# Patient Record
Sex: Female | Born: 1985 | Hispanic: No | Marital: Single | Smoking: Current every day smoker
Health system: Southern US, Community
[De-identification: ages and names within clinical notes are randomized; demographics above are authoritative.]

## PROBLEM LIST (undated history)

## (undated) HISTORY — PX: DILATION AND CURETTAGE OF UTERUS: SHX78

---

## 2019-02-16 ENCOUNTER — Encounter (HOSPITAL_COMMUNITY): Payer: Self-pay

## 2019-02-16 ENCOUNTER — Other Ambulatory Visit: Payer: Self-pay

## 2019-02-16 ENCOUNTER — Emergency Department (HOSPITAL_COMMUNITY)
Admission: EM | Admit: 2019-02-16 | Discharge: 2019-02-16 | Disposition: A | Discharge status: Home / Self Care | Payer: Self-pay | Attending: Emergency Medicine | Admitting: Emergency Medicine

## 2019-02-16 DIAGNOSIS — N944 Primary dysmenorrhea: Secondary | ICD-10-CM | POA: Insufficient documentation

## 2019-02-16 DIAGNOSIS — N946 Dysmenorrhea, unspecified: Secondary | ICD-10-CM

## 2019-02-16 LAB — POC URINE PREG, ED: Preg Test, Ur: NEGATIVE

## 2019-02-16 LAB — HCG, SERUM, QUALITATIVE: Preg, Serum: NEGATIVE

## 2019-02-16 MED ORDER — IBUPROFEN 600 MG PO TABS: 600.0000 mg | ORAL_TABLET | Freq: Four times a day (QID) | ORAL | 0 refills | Status: DC | PRN

## 2019-02-16 MED ORDER — IBUPROFEN 800 MG PO TABS
800.0000 mg | ORAL_TABLET | Freq: Once | ORAL | Status: AC
  Administered 2019-02-16: 800 mg via ORAL
  Filled 2019-02-16: qty 1

## 2019-02-16 NOTE — ED Triage Notes (Signed)
Pt reports moving down here about a month ago from [place]. Pt says she took a pregnancy test about 2 months ago and it was positive. Pt is c/o abd cramping and "spotting blood" for the past 2-3 days. Pt has hx of miscarriage. Pt has had no prenatal care.

## 2019-02-16 NOTE — ED Provider Notes (Signed)
[facility] EMERGENCY DEPARTMENT Provider Note   CSN: [id number] Arrival date & time: 02/16/19  3335    History   Chief Complaint Chief Complaint  Patient presents with  . Abdominal Pain    HPI Molly Koch is a 33 y.o. female.     The history is provided by the patient. No language interpreter was used.  Abdominal Pain  Pain location:  Generalized Pain quality: aching   Pain radiates to:  Does not radiate Onset quality:  Gradual Timing:  Constant Progression:  Worsening Chronicity:  New Relieved by:  Nothing Worsened by:  Nothing Ineffective treatments:  None tried Associated symptoms: no vomiting    Pt reports she had a positive pregnancy test last month.  Pt reports she is now having bleeding.  Pt reports cramping History reviewed. No pertinent past medical history.  There are no active problems to display for this patient.   Past Surgical History:  Procedure Laterality Date  . DILATION AND CURETTAGE OF UTERUS       OB History    Gravida  1   Para      Term      Preterm      AB      Living        SAB      TAB      Ectopic      Multiple      Live Births               Home Medications    Prior to Admission medications   Not on File    Family History No family history on file.  Social History Social History   Tobacco Use  . Smoking status: Current Every Day Smoker    Packs/day: 0.50    Types: Cigarettes  . Smokeless tobacco: Never Used  Substance Use Topics  . Alcohol use: Not on file  . Drug use: Not on file     Allergies   Patient has no known allergies.   Review of Systems Review of Systems  Gastrointestinal: Positive for abdominal pain. Negative for vomiting.  All other systems reviewed and are negative.    Physical Exam Updated Vital Signs BP 104/81 (BP Location: Right Arm)   Pulse 72   Temp (!) 97.3 F (36.3 C) (Oral)   Resp 17   Ht 5\' 3"  (1.6 m)   Wt 70.3 kg   LMP 11/29/2018 (Approximate)  Comment: home preg test positive- 2 months ago  SpO2 100%   BMI 27.46 kg/m   Physical Exam Vitals signs and nursing note reviewed.  Constitutional:      General: She is not in acute distress.    Appearance: She is well-developed.  HENT:     Head: Normocephalic and atraumatic.  Eyes:     Conjunctiva/sclera: Conjunctivae normal.  Neck:     Musculoskeletal: Neck supple.  Cardiovascular:     Rate and Rhythm: Normal rate and regular rhythm.     Heart sounds: No murmur.  Pulmonary:     Effort: Pulmonary effort is normal. No respiratory distress.     Breath sounds: Normal breath sounds.  Abdominal:     General: Bowel sounds are normal.     Palpations: Abdomen is soft.     Tenderness: There is generalized abdominal tenderness.  Genitourinary:    Vagina: Bleeding present.     Cervix: Normal.     Uterus: Normal.      Adnexa: Right adnexa normal and left  adnexa normal.  Skin:    General: Skin is warm and dry.  Neurological:     General: No focal deficit present.     Mental Status: She is alert.  Psychiatric:        Mood and Affect: Mood normal.      ED Treatments / Results  Labs (all labs ordered are listed, but only abnormal results are displayed) Labs Reviewed  HCG, SERUM, QUALITATIVE  POC URINE PREG, ED    EKG None  Radiology No results found.  Procedures Procedures (including critical care time)  Medications Ordered in ED Medications  ibuprofen (ADVIL) tablet 800 mg (has no administration in time range)     Initial Impression / Assessment and Plan / ED Course  I have reviewed the triage vital signs and the nursing notes.  Pertinent labs & imaging results that were available during my care of the patient were reviewed by me and considered in my medical decision making (see chart for details).        MDM  Exam consistent with menstrual cycle.  Pregnancy test is negative Pt given ibuprofen here and rx for ibuprofen   Final Clinical Impressions(s) / ED  Diagnoses   Final diagnoses:  Menstrual cramps    ED Discharge Orders         Ordered    ibuprofen (ADVIL) 600 MG tablet  Every 6 hours PRN     02/16/19 2215        An After Visit Summary was printed and given to the patient.    Osie CheeksSofia, Nnamdi Dacus K, PA-C 02/16/19 2215    Mancel BaleWentz, Elliott, MD 02/17/19 1134

## 2019-02-16 NOTE — Discharge Instructions (Signed)
Return if any problems.

## 2019-02-23 ENCOUNTER — Emergency Department (HOSPITAL_COMMUNITY): Payer: Self-pay

## 2019-02-23 ENCOUNTER — Other Ambulatory Visit: Payer: Self-pay

## 2019-02-23 ENCOUNTER — Emergency Department (HOSPITAL_COMMUNITY)
Admission: EM | Admit: 2019-02-23 | Discharge: 2019-02-23 | Disposition: A | Discharge status: Home / Self Care | Payer: Self-pay | Attending: Emergency Medicine | Admitting: Emergency Medicine

## 2019-02-23 ENCOUNTER — Encounter (HOSPITAL_COMMUNITY): Payer: Self-pay | Admitting: Emergency Medicine

## 2019-02-23 DIAGNOSIS — M79672 Pain in left foot: Secondary | ICD-10-CM | POA: Insufficient documentation

## 2019-02-23 DIAGNOSIS — M25521 Pain in right elbow: Secondary | ICD-10-CM | POA: Insufficient documentation

## 2019-02-23 DIAGNOSIS — F17210 Nicotine dependence, cigarettes, uncomplicated: Secondary | ICD-10-CM | POA: Insufficient documentation

## 2019-02-23 DIAGNOSIS — M79641 Pain in right hand: Secondary | ICD-10-CM | POA: Insufficient documentation

## 2019-02-23 MED ORDER — NAPROXEN 500 MG PO TABS: ORAL_TABLET | ORAL | 0 refills | Status: DC
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
## 2019-02-23 NOTE — Discharge Instructions (Addendum)
Follow up with a family md or Dr. Romeo Apple if not improving
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
## 2019-07-11 ENCOUNTER — Other Ambulatory Visit: Payer: Self-pay

## 2019-07-11 ENCOUNTER — Emergency Department (HOSPITAL_COMMUNITY)
Admission: EM | Admit: 2019-07-11 | Discharge: 2019-07-11 | Disposition: A | Discharge status: Home / Self Care | Payer: Self-pay | Attending: Emergency Medicine | Admitting: Emergency Medicine

## 2019-07-11 ENCOUNTER — Encounter (HOSPITAL_COMMUNITY): Payer: Self-pay | Admitting: Emergency Medicine

## 2019-07-11 ENCOUNTER — Emergency Department (HOSPITAL_COMMUNITY): Payer: Self-pay

## 2019-07-11 DIAGNOSIS — W2209XA Striking against other stationary object, initial encounter: Secondary | ICD-10-CM | POA: Insufficient documentation

## 2019-07-11 DIAGNOSIS — Y929 Unspecified place or not applicable: Secondary | ICD-10-CM | POA: Insufficient documentation

## 2019-07-11 DIAGNOSIS — S62314A Displaced fracture of base of fourth metacarpal bone, right hand, initial encounter for closed fracture: Secondary | ICD-10-CM | POA: Insufficient documentation

## 2019-07-11 DIAGNOSIS — F17210 Nicotine dependence, cigarettes, uncomplicated: Secondary | ICD-10-CM | POA: Insufficient documentation

## 2019-07-11 DIAGNOSIS — Y999 Unspecified external cause status: Secondary | ICD-10-CM | POA: Insufficient documentation

## 2019-07-11 DIAGNOSIS — S62304A Unspecified fracture of fourth metacarpal bone, right hand, initial encounter for closed fracture: Secondary | ICD-10-CM

## 2019-07-11 DIAGNOSIS — Y939 Activity, unspecified: Secondary | ICD-10-CM | POA: Insufficient documentation

## 2019-07-11 MED ORDER — OXYCODONE-ACETAMINOPHEN 5-325 MG PO TABS: 1.0000 | ORAL_TABLET | ORAL | 0 refills | Status: AC | PRN

## 2019-07-11 MED ORDER — OXYCODONE-ACETAMINOPHEN 5-325 MG PO TABS
1.0000 | ORAL_TABLET | Freq: Once | ORAL | Status: AC
  Administered 2019-07-11: 1 via ORAL
  Filled 2019-07-11: qty 1

## 2019-07-11 NOTE — Discharge Instructions (Addendum)
Keep your hand elevated and splinted.  Call Dr. Debroah Loop office tomorrow to arrange a follow-up appointment.  You may use the sling as needed.  Ibuprofen 600 mg every 6-8 hours if needed for pain.  Take with food.
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
## 2019-07-11 NOTE — ED Notes (Signed)
Ice pack to right hand

## 2019-07-12 MED FILL — Oxycodone w/ Acetaminophen Tab 5-325 MG: ORAL | Qty: 6 | Status: AC

## 2019-07-20 ENCOUNTER — Encounter (HOSPITAL_COMMUNITY): Payer: Self-pay | Admitting: Emergency Medicine

## 2019-07-20 ENCOUNTER — Other Ambulatory Visit: Payer: Self-pay

## 2019-07-20 DIAGNOSIS — Y999 Unspecified external cause status: Secondary | ICD-10-CM | POA: Insufficient documentation

## 2019-07-20 DIAGNOSIS — S0083XA Contusion of other part of head, initial encounter: Secondary | ICD-10-CM | POA: Insufficient documentation

## 2019-07-20 DIAGNOSIS — F17210 Nicotine dependence, cigarettes, uncomplicated: Secondary | ICD-10-CM | POA: Insufficient documentation

## 2019-07-20 DIAGNOSIS — Y9389 Activity, other specified: Secondary | ICD-10-CM | POA: Insufficient documentation

## 2019-07-20 DIAGNOSIS — Y9289 Other specified places as the place of occurrence of the external cause: Secondary | ICD-10-CM | POA: Insufficient documentation

## 2019-07-20 DIAGNOSIS — M542 Cervicalgia: Secondary | ICD-10-CM | POA: Insufficient documentation
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
## 2019-07-21 ENCOUNTER — Emergency Department (HOSPITAL_COMMUNITY): Payer: Self-pay

## 2019-07-21 ENCOUNTER — Emergency Department (HOSPITAL_COMMUNITY)
Admission: EM | Admit: 2019-07-21 | Discharge: 2019-07-21 | Disposition: A | Discharge status: Home / Self Care | Payer: Self-pay | Attending: Emergency Medicine | Admitting: Emergency Medicine

## 2019-07-21 DIAGNOSIS — M542 Cervicalgia: Secondary | ICD-10-CM

## 2019-07-21 DIAGNOSIS — S0083XA Contusion of other part of head, initial encounter: Secondary | ICD-10-CM

## 2019-07-21 NOTE — Discharge Instructions (Addendum)
You need to follow-up with the orthopedist about your hand fracture.  You can see if Dr. Aline Brochure will see you, he is an orthopedist in [place].  Call his office for an appointment.  Elevate your injured areas, use ice packs for comfort.  You can take ibuprofen 600 mg 4 times a day for pain.  Return to the emergency department for any problems listed on the head injury sheet.  Please make sure that you are safe.

## 2019-07-21 NOTE — ED Notes (Signed)
Called x 1 no answer
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
## 2019-07-31 ENCOUNTER — Other Ambulatory Visit: Payer: Self-pay

## 2019-07-31 ENCOUNTER — Encounter (HOSPITAL_COMMUNITY): Payer: Self-pay | Admitting: Emergency Medicine

## 2019-07-31 DIAGNOSIS — F17210 Nicotine dependence, cigarettes, uncomplicated: Secondary | ICD-10-CM | POA: Insufficient documentation

## 2019-07-31 DIAGNOSIS — N3090 Cystitis, unspecified without hematuria: Secondary | ICD-10-CM | POA: Insufficient documentation

## 2019-07-31 NOTE — ED Triage Notes (Signed)
Pt c/o lower abd and back pain, and burning when she pees.

## 2019-08-01 ENCOUNTER — Emergency Department (HOSPITAL_COMMUNITY)
Admission: EM | Admit: 2019-08-01 | Discharge: 2019-08-01 | Disposition: A | Discharge status: Home / Self Care | Payer: Self-pay | Attending: Emergency Medicine | Admitting: Emergency Medicine

## 2019-08-01 DIAGNOSIS — N3000 Acute cystitis without hematuria: Secondary | ICD-10-CM

## 2019-08-01 LAB — URINALYSIS, ROUTINE W REFLEX MICROSCOPIC
Bilirubin Urine: NEGATIVE
Glucose, UA: NEGATIVE mg/dL
Hgb urine dipstick: NEGATIVE
Ketones, ur: NEGATIVE mg/dL
Nitrite: NEGATIVE
Protein, ur: NEGATIVE mg/dL
Specific Gravity, Urine: 1.011 (ref 1.005–1.030)
WBC, UA: >50 WBC/hpf — ABNORMAL HIGH (ref 0–5)
pH: 7 (ref 5.0–8.0)

## 2019-08-01 MED ORDER — CEPHALEXIN 500 MG PO CAPS
500.0000 mg | ORAL_CAPSULE | Freq: Once | ORAL | Status: AC
  Administered 2019-08-01: 500 mg via ORAL
  Filled 2019-08-01: qty 1

## 2019-08-01 MED ORDER — CEPHALEXIN 500 MG PO CAPS: 500.0000 mg | ORAL_CAPSULE | Freq: Three times a day (TID) | ORAL | 0 refills | Status: DC

## 2019-08-01 MED ORDER — PHENAZOPYRIDINE HCL 100 MG PO TABS
200.0000 mg | ORAL_TABLET | Freq: Once | ORAL | Status: AC
  Administered 2019-08-01: 200 mg via ORAL
  Filled 2019-08-01: qty 2

## 2019-08-01 MED ORDER — PHENAZOPYRIDINE HCL 200 MG PO TABS: 200.0000 mg | ORAL_TABLET | Freq: Three times a day (TID) | ORAL | 0 refills | Status: AC

## 2019-08-01 NOTE — ED Provider Notes (Signed)
[facility] EMERGENCY DEPARTMENT Provider Note   CSN: [id number] Arrival date & time: 07/31/19  2209   Time seen 1:40 AM  History   Chief Complaint Chief Complaint  Patient presents with  . Urinary Tract Infection    HPI [facility] is a 33 y.o. female.     HPI patient states about 5 days ago she started having pain after urination and she feels the urge to go.  She also states she has some pain over her bladder and in her lower midline back.  No nausea, vomiting, fever, or blood in her urine.  No flank pain.  She states she had UTIs before but many years ago.   PCP Patient, No Pcp Per   History reviewed. No pertinent past medical history.  There are no active problems to display for this patient.   Past Surgical History:  Procedure Laterality Date  . DILATION AND CURETTAGE OF UTERUS       OB History    Gravida  1   Para      Term      Preterm      AB      Living        SAB      TAB      Ectopic      Multiple      Live Births               Home Medications    Prior to Admission medications   Medication Sig Start Date End Date Taking? Authorizing Provider  cephALEXin (KEFLEX) 500 MG capsule Take 1 capsule (500 mg total) by mouth 3 (three) times daily. 08/01/19   Devoria Albe, MD  oxyCODONE-acetaminophen (PERCOCET/ROXICET) 5-325 MG tablet Take 1 tablet by mouth every 4 (four) hours as needed for severe pain. 07/11/19   Triplett, Tammy, PA-C  phenazopyridine (PYRIDIUM) 200 MG tablet Take 1 tablet (200 mg total) by mouth 3 (three) times daily. 08/01/19   Devoria Albe, MD    Family History No family history on file.  Social History Social History   Tobacco Use  . Smoking status: Current Every Day Smoker    Packs/day: 0.50    Types: Cigarettes  . Smokeless tobacco: Never Used  Substance Use Topics  . Alcohol use: Yes  . Drug use: Not on file     Allergies   Patient has no known allergies.   Review of Systems Review of Systems   All other systems reviewed and are negative.    Physical Exam Updated Vital Signs BP (!) 132/112   Pulse 74   Temp 98.1 F (36.7 C)   Resp 18   Ht 5\' 3"  (1.6 m)   Wt 72.6 kg   LMP 07/20/2019   SpO2 97%   BMI 28.34 kg/m   Physical Exam Vitals signs and nursing note reviewed.  Constitutional:      Appearance: Normal appearance.  HENT:     Head: Normocephalic and atraumatic.     Right Ear: External ear normal.     Left Ear: External ear normal.  Eyes:     Extraocular Movements: Extraocular movements intact.     Conjunctiva/sclera: Conjunctivae normal.  Neck:     Musculoskeletal: Normal range of motion.  Cardiovascular:     Rate and Rhythm: Normal rate.  Pulmonary:     Effort: Pulmonary effort is normal. No respiratory distress.  Abdominal:     General: Abdomen is flat. Bowel sounds are normal.  Palpations: Abdomen is soft.     Tenderness: There is abdominal tenderness in the suprapubic area. There is no right CVA tenderness, left CVA tenderness, guarding or rebound.    Musculoskeletal: Normal range of motion.       Back:     Comments: Tender to palpation in her lower lumbar spine, no CVA tenderness  Skin:    General: Skin is warm and dry.  Neurological:     General: No focal deficit present.     Mental Status: She is alert and oriented to person, place, and time.     Cranial Nerves: No cranial nerve deficit.  Psychiatric:        Mood and Affect: Mood normal.        Behavior: Behavior normal.        Thought Content: Thought content normal.      ED Treatments / Results  Labs (all labs ordered are listed, but only abnormal results are displayed) Results for orders placed or performed during the hospital encounter of 08/01/19  Urinalysis, Routine w reflex microscopic  Result Value Ref Range   Color, Urine YELLOW YELLOW   APPearance CLEAR CLEAR   Specific Gravity, Urine 1.011 1.005 - 1.030   pH 7.0 5.0 - 8.0   Glucose, UA NEGATIVE NEGATIVE mg/dL   Hgb  urine dipstick NEGATIVE NEGATIVE   Bilirubin Urine NEGATIVE NEGATIVE   Ketones, ur NEGATIVE NEGATIVE mg/dL   Protein, ur NEGATIVE NEGATIVE mg/dL   Nitrite NEGATIVE NEGATIVE   Leukocytes,Ua SMALL (A) NEGATIVE   RBC / HPF 0-5 0 - 5 RBC/hpf   WBC, UA >50 (H) 0 - 5 WBC/hpf   Bacteria, UA RARE (A) NONE SEEN   Squamous Epithelial / LPF 0-5 0 - 5    Laboratory interpretation all normal except probable UTI   EKG None  Radiology No results found.  Procedures Procedures (including critical care time)  Medications Ordered in ED Medications  cephALEXin (KEFLEX) capsule 500 mg (has no administration in time range)  phenazopyridine (PYRIDIUM) tablet 200 mg (has no administration in time range)     Initial Impression / Assessment and Plan / ED Course  I have reviewed the triage vital signs and the nursing notes.  Pertinent labs & imaging results that were available during my care of the patient were reviewed by me and considered in my medical decision making (see chart for details).        Patient was given Keflex and Pyridium for her urinary tract infection.  Final Clinical Impressions(s) / ED Diagnoses   Final diagnoses:  Acute cystitis without hematuria    ED Discharge Orders         Ordered    cephALEXin (KEFLEX) 500 MG capsule  3 times daily     08/01/19 0242    phenazopyridine (PYRIDIUM) 200 MG tablet  3 times daily     08/01/19 0242          Plan discharge  Rolland Porter, MD, Barbette Or, MD 08/01/19 [phone]

## 2019-08-01 NOTE — Discharge Instructions (Addendum)
Drink plenty of fluids.  Take the antibiotics until gone.  Take the Pyridium for the discomfort with urination, it will turn your urine orange and stain your underwear if you have leakage.  Recheck if you get fever, vomiting, or worsening pain.

## 2019-08-31 ENCOUNTER — Emergency Department (HOSPITAL_COMMUNITY): Payer: Self-pay

## 2019-08-31 ENCOUNTER — Encounter (HOSPITAL_COMMUNITY): Payer: Self-pay | Admitting: Emergency Medicine

## 2019-08-31 ENCOUNTER — Other Ambulatory Visit: Payer: Self-pay

## 2019-08-31 ENCOUNTER — Emergency Department (HOSPITAL_COMMUNITY)
Admission: EM | Admit: 2019-08-31 | Discharge: 2019-08-31 | Disposition: A | Discharge status: Home / Self Care | Payer: Self-pay | Attending: Emergency Medicine | Admitting: Emergency Medicine

## 2019-08-31 DIAGNOSIS — M545 Low back pain: Secondary | ICD-10-CM | POA: Insufficient documentation

## 2019-08-31 DIAGNOSIS — S6991XA Unspecified injury of right wrist, hand and finger(s), initial encounter: Secondary | ICD-10-CM | POA: Insufficient documentation

## 2019-08-31 DIAGNOSIS — F17210 Nicotine dependence, cigarettes, uncomplicated: Secondary | ICD-10-CM | POA: Insufficient documentation

## 2019-08-31 DIAGNOSIS — W2209XA Striking against other stationary object, initial encounter: Secondary | ICD-10-CM | POA: Insufficient documentation

## 2019-08-31 DIAGNOSIS — Y999 Unspecified external cause status: Secondary | ICD-10-CM | POA: Insufficient documentation

## 2019-08-31 DIAGNOSIS — R300 Dysuria: Secondary | ICD-10-CM | POA: Insufficient documentation

## 2019-08-31 DIAGNOSIS — Y939 Activity, unspecified: Secondary | ICD-10-CM | POA: Insufficient documentation

## 2019-08-31 DIAGNOSIS — R110 Nausea: Secondary | ICD-10-CM | POA: Insufficient documentation

## 2019-08-31 DIAGNOSIS — Y929 Unspecified place or not applicable: Secondary | ICD-10-CM | POA: Insufficient documentation

## 2019-08-31 LAB — URINALYSIS, MICROSCOPIC (REFLEX): RBC / HPF: NONE SEEN RBC/hpf (ref 0–5)

## 2019-08-31 LAB — URINALYSIS, ROUTINE W REFLEX MICROSCOPIC
Bilirubin Urine: NEGATIVE
Glucose, UA: NEGATIVE mg/dL
Hgb urine dipstick: NEGATIVE
Ketones, ur: NEGATIVE mg/dL
Nitrite: NEGATIVE
Protein, ur: NEGATIVE mg/dL
Specific Gravity, Urine: 1.02 (ref 1.005–1.030)
pH: 7 (ref 5.0–8.0)

## 2019-08-31 LAB — POC URINE PREG, ED: Preg Test, Ur: NEGATIVE

## 2019-08-31 MED ORDER — CEPHALEXIN 500 MG PO CAPS: 500.0000 mg | ORAL_CAPSULE | Freq: Four times a day (QID) | ORAL | 0 refills | Status: AC

## 2019-08-31 MED ORDER — ACETAMINOPHEN 500 MG PO TABS
1000.0000 mg | ORAL_TABLET | Freq: Once | ORAL | Status: AC
  Administered 2019-08-31: 1000 mg via ORAL
  Filled 2019-08-31: qty 2

## 2019-08-31 MED FILL — CEPHALEXIN 500 MG CAPSULE: 500 | 5 days supply | Qty: 20 | Fill #0
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
## 2019-08-31 NOTE — Discharge Planning (Signed)
ED CM consulted by EDP Donney Rankins for medication assistance. EDCM reviewed chart and spoke with the pt about [facility] MATCH program ($3 co pay for each Rx through [facility] program, does not include refills, 7 day expiration of [place] letter and choice of pharmacies). Pt is eligible for [facility] MATCH program (unable to find pt listed in PROCARE per cardholder name inquiry) and has agreed to accept [facility] under terms discussed. PROCARE information entered. [place] letter completed and provided to pt.  EDCM updated EDP and ED RN.

## 2019-08-31 NOTE — TOC Transition Note (Signed)
                     Dear   :Serita Sheller have been approved to have the prescriptions written by your discharging physician filled through our [facility] (Medication Assistance Through [facility]) program. This program allows for a one-time (no refills) 34-day supply of selected medications for a low copay amount.  The copay is $3.00 per prescription. For instance, if you have one prescription, you will pay $3.00; for two prescriptions, you pay $6.00; for three prescriptions, you pay $9.00; and so on.  Only certain pharmacies are participating in this program with [facility]. You will need to select one of the pharmacies from the attached list and take your prescriptions, this letter, and your photo ID to one of the participating pharmacies.   We are excited that you are able to use the [facility] program to get your medications. These prescriptions must be filled within 7 days of hospital discharge or they will no longer be valid for the [facility] program. Should you have any problems with your prescriptions please contact your case management team member at [phone] for [place] [facility]/ [place] you, [place] Management

## 2019-08-31 NOTE — ED Notes (Signed)
Case manager assisting patient.

## 2019-08-31 NOTE — Discharge Instructions (Addendum)
You were seen in the ER for hand injury and concern for ongoing burning with urination  X-ray today showed previous fracture of the 4th finger that is healing, no new fracture.  Wear brace to help with stabilization, compression. Ice. Elevate. Start early range of motion exercises after 72 hr of rest.  Alternate ibuprofen/acetaminophen for pain.  Follow up with primary care doctor in 10 days if symptoms not improving. Return to ER for worsening pain, swelling, loss of sensation or tingling in hand  Urine sample showed some cells that suggest infection.  We have sent a urine culture to confirm infection in urine.  For now, take keflex for symptoms as prescribed.  Stay hydrated. Return for worsening symptoms, fever, abdominal or flank pain, vaginal symptoms.

## 2019-08-31 NOTE — ED Notes (Signed)
Pt discharge instructions reviewed with the patient. Pt verbalized understanding of discharge instructions. Pt discharged. 
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
## 2019-09-01 LAB — URINE CULTURE

## 2019-09-03 ENCOUNTER — Emergency Department (HOSPITAL_COMMUNITY)
Admission: EM | Admit: 2019-09-03 | Discharge: 2019-09-04 | Disposition: A | Discharge status: Home / Self Care | Payer: Self-pay | Attending: Emergency Medicine | Admitting: Emergency Medicine

## 2019-09-03 ENCOUNTER — Encounter (HOSPITAL_COMMUNITY): Payer: Self-pay | Admitting: Emergency Medicine

## 2019-09-03 ENCOUNTER — Other Ambulatory Visit: Payer: Self-pay

## 2019-09-03 DIAGNOSIS — M79604 Pain in right leg: Secondary | ICD-10-CM | POA: Insufficient documentation

## 2019-09-03 DIAGNOSIS — M79605 Pain in left leg: Secondary | ICD-10-CM | POA: Insufficient documentation

## 2019-09-03 DIAGNOSIS — M545 Low back pain, unspecified: Secondary | ICD-10-CM

## 2019-09-03 DIAGNOSIS — F17210 Nicotine dependence, cigarettes, uncomplicated: Secondary | ICD-10-CM | POA: Insufficient documentation

## 2019-09-03 DIAGNOSIS — Z79899 Other long term (current) drug therapy: Secondary | ICD-10-CM | POA: Insufficient documentation

## 2019-09-03 NOTE — ED Triage Notes (Signed)
Patient reports bilateral legs pain and low back pain today , denies injury /ambulatory , no hematuria , denies fever or chills .

## 2019-09-03 NOTE — ED Provider Notes (Signed)
[name] EMERGENCY DEPARTMENT Provider Note   CSN: [id number] Arrival date & time: 09/03/19  1815     History   Chief Complaint Chief Complaint  Patient presents with  . Back Pain  . Leg Pain    HPI Molly Koch is a 33 y.o. female.     HPI   Pt is a 33 y/o female who presents to the ED today for eval of back pain that started 1 week ago. States pain is present when she walks but improves when she is sitting. Pain 0/10 when sitting but increases with any movement. Pain radiates down her BLE. Pain feels like an aching pain. Denies any numbness. She feels like she has weakness to her bilateral knees. She has tried tylenol without relief. Reports nausea. Denies abd pain, vomiting, dysuria, frequency, urgency. Denies loss of control of bowels or bladder. No urinary retention. No fevers. Denies a h/o IVDU. Denies a h/o CA. Has been ambulatory at home. Denies recent falls, trauma, or heavy lifting.   History reviewed. No pertinent past medical history.  There are no active problems to display for this patient.   Past Surgical History:  Procedure Laterality Date  . DILATION AND CURETTAGE OF UTERUS       OB History    Gravida  1   Para      Term      Preterm      AB      Living        SAB      TAB      Ectopic      Multiple      Live Births               Home Medications    Prior to Admission medications   Medication Sig Start Date End Date Taking? Authorizing Provider  cephALEXin (KEFLEX) 500 MG capsule Take 1 capsule (500 mg total) by mouth 4 (four) times daily. 08/31/19   Kinnie Feil, PA-C  methocarbamol (ROBAXIN) 750 MG tablet Take 1 tablet (750 mg total) by mouth at bedtime as needed for up to 5 days for muscle spasms. 09/04/19 09/09/19  Tyion Boylen S, PA-C  naproxen (NAPROSYN) 500 MG tablet Take 1 tablet (500 mg total) by mouth 2 (two) times daily for 5 days. 09/04/19 09/09/19  Memory Heinrichs S, PA-C   oxyCODONE-acetaminophen (PERCOCET/ROXICET) 5-325 MG tablet Take 1 tablet by mouth every 4 (four) hours as needed for severe pain. 07/11/19   Triplett, Tammy, PA-C  phenazopyridine (PYRIDIUM) 200 MG tablet Take 1 tablet (200 mg total) by mouth 3 (three) times daily. 08/01/19   Rolland Porter, MD    Family History No family history on file.  Social History Social History   Tobacco Use  . Smoking status: Current Every Day Smoker    Packs/day: 0.50    Types: Cigarettes  . Smokeless tobacco: Never Used  Substance Use Topics  . Alcohol use: Yes  . Drug use: Not on file     Allergies   Patient has no known allergies.   Review of Systems Review of Systems  Constitutional: Negative for fever.  Respiratory: Negative for shortness of breath.   Cardiovascular: Negative for chest pain.  Gastrointestinal: Negative for abdominal pain, blood in stool, constipation, diarrhea, nausea and vomiting.  Genitourinary: Negative for dysuria, flank pain, frequency, hematuria and urgency.  Musculoskeletal: Positive for back pain. Negative for gait problem.  Skin: Negative for wound.  Neurological: Positive for weakness.  Negative for numbness.     Physical Exam Updated Vital Signs BP 106/72 (BP Location: Right Arm)   Pulse 85   Temp 98.8 F (37.1 C) (Oral)   Resp 16   LMP 08/23/2019 (Approximate)   SpO2 98%   Physical Exam Vitals signs and nursing note reviewed.  Constitutional:      General: She is not in acute distress.    Appearance: She is well-developed.  HENT:     Head: Normocephalic and atraumatic.  Eyes:     Conjunctiva/sclera: Conjunctivae normal.  Neck:     Musculoskeletal: Neck supple.  Cardiovascular:     Rate and Rhythm: Normal rate.  Pulmonary:     Effort: Pulmonary effort is normal.  Musculoskeletal: Normal range of motion.     Comments: Diffuse ttp to the lumbar spine with assisted paraspinous muscle TTP bilaterally.   Skin:    General: Skin is warm and dry.   Neurological:     Mental Status: She is alert.     Comments: Motor:  Normal tone. 5/5 strength of BUE and BLE major muscle groups including strong and equal grip strength and dorsiflexion/plantar flexion Sensory: light touch normal in all extremities. Gait: normal gait and balance.     ED Treatments / Results  Labs (all labs ordered are listed, but only abnormal results are displayed) Labs Reviewed - No data to display  EKG None  Radiology No results found.  Procedures Procedures (including critical care time)  Medications Ordered in ED Medications - No data to display   Initial Impression / Assessment and Plan / ED Course  I have reviewed the triage vital signs and the nursing notes.  Pertinent labs & imaging results that were available during my care of the patient were reviewed by me and considered in my medical decision making (see chart for details).     Final Clinical Impressions(s) / ED Diagnoses   Final diagnoses:  Acute midline low back pain, unspecified whether sciatica present   Patient with back pain.  No neurological deficits and normal neuro exam.  Patient can walk but states is painful.  No loss of bowel or bladder control.  No concern for cauda equina.  No fever, night sweats, weight loss, h/o cancer, IVDU.  RICE protocol and pain medicine indicated and discussed with patient.    ED Discharge Orders         Ordered    methocarbamol (ROBAXIN) 750 MG tablet  At bedtime PRN     09/04/19 0008    naproxen (NAPROSYN) 500 MG tablet  2 times daily     09/04/19 0008           Karrie Meres, PA-C 09/04/19 0038    Zadie Rhine, MD 09/04/19 0320

## 2019-09-04 ENCOUNTER — Emergency Department (HOSPITAL_COMMUNITY)
Admission: EM | Admit: 2019-09-04 | Discharge: 2019-09-05 | Disposition: A | Discharge status: Home / Self Care | Payer: Self-pay | Attending: Emergency Medicine | Admitting: Emergency Medicine

## 2019-09-04 ENCOUNTER — Emergency Department (HOSPITAL_COMMUNITY): Payer: Self-pay

## 2019-09-04 ENCOUNTER — Other Ambulatory Visit: Payer: Self-pay

## 2019-09-04 DIAGNOSIS — R05 Cough: Secondary | ICD-10-CM | POA: Insufficient documentation

## 2019-09-04 DIAGNOSIS — R0789 Other chest pain: Secondary | ICD-10-CM | POA: Insufficient documentation

## 2019-09-04 DIAGNOSIS — F17210 Nicotine dependence, cigarettes, uncomplicated: Secondary | ICD-10-CM | POA: Insufficient documentation

## 2019-09-04 DIAGNOSIS — Z79899 Other long term (current) drug therapy: Secondary | ICD-10-CM | POA: Insufficient documentation

## 2019-09-04 DIAGNOSIS — R079 Chest pain, unspecified: Secondary | ICD-10-CM

## 2019-09-04 DIAGNOSIS — R059 Cough, unspecified: Secondary | ICD-10-CM

## 2019-09-04 DIAGNOSIS — Z20828 Contact with and (suspected) exposure to other viral communicable diseases: Secondary | ICD-10-CM | POA: Insufficient documentation

## 2019-09-04 LAB — BASIC METABOLIC PANEL
Anion gap: 7 (ref 5–15)
BUN: 11 mg/dL (ref 6–20)
CO2: 22 mmol/L (ref 22–32)
Calcium: 8.5 mg/dL — ABNORMAL LOW (ref 8.9–10.3)
Chloride: 107 mmol/L (ref 98–111)
Creatinine, Ser: 0.69 mg/dL (ref 0.44–1.00)
GFR calc Af Amer: >60 mL/min (ref >60)
GFR calc non Af Amer: >60 mL/min (ref >60)
Glucose, Bld: 100 mg/dL — ABNORMAL HIGH (ref 70–99)
Potassium: 4.4 mmol/L (ref 3.5–5.1)
Sodium: 136 mmol/L (ref 135–145)

## 2019-09-04 LAB — CBC
HCT: 43.2 % (ref 36.0–46.0)
Hemoglobin: 14.2 g/dL (ref 12.0–15.0)
MCH: 29.8 pg (ref 26.0–34.0)
MCHC: 32.9 g/dL (ref 30.0–36.0)
MCV: 90.8 fL (ref 80.0–100.0)
Platelets: 261 10*3/uL (ref 150–400)
RBC: 4.76 MIL/uL (ref 3.87–5.11)
RDW: 11.4 % — ABNORMAL LOW (ref 11.5–15.5)
WBC: 9.2 10*3/uL (ref 4.0–10.5)
nRBC: 0 % (ref 0.0–0.2)

## 2019-09-04 LAB — I-STAT BETA HCG BLOOD, ED (MC, WL, AP ONLY): I-stat hCG, quantitative: <5 m[IU]/mL (ref <5)

## 2019-09-04 LAB — TROPONIN I (HIGH SENSITIVITY): Troponin I (High Sensitivity): 2 ng/L (ref <18)

## 2019-09-04 MED ORDER — METHOCARBAMOL 750 MG PO TABS: 750.0000 mg | ORAL_TABLET | Freq: Every evening | ORAL | 0 refills | Status: AC | PRN

## 2019-09-04 MED ORDER — NAPROXEN 500 MG PO TABS: 500.0000 mg | ORAL_TABLET | Freq: Two times a day (BID) | ORAL | 0 refills | Status: AC

## 2019-09-04 NOTE — Discharge Instructions (Addendum)

## 2019-09-04 NOTE — ED Triage Notes (Addendum)
Patient states that she is having CP and that her sister told her that she had a "seizure in my sleep". Pt is A&O x3. States not taking seizure meds and not being treated for seizures. Was seen yesterday for leg pain.

## 2019-09-05 LAB — TROPONIN I (HIGH SENSITIVITY): Troponin I (High Sensitivity): <2 ng/L (ref <18)

## 2019-09-05 LAB — D-DIMER, QUANTITATIVE: D-Dimer, Quant: 0.37 ug/mL-FEU (ref 0.00–0.50)

## 2019-09-05 MED ORDER — SODIUM CHLORIDE 0.9 % IV BOLUS
1000.0000 mL | Freq: Once | INTRAVENOUS | Status: AC
  Administered 2019-09-05: 1000 mL via INTRAVENOUS

## 2019-09-05 NOTE — Discharge Instructions (Signed)
You should isolate yourself at home until your coronavirus test results.  If your result is positive, you will need to stay out for 14 days.  If your result is negative, you can return to work.
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
## 2019-09-07 LAB — NOVEL CORONAVIRUS, NAA (HOSP ORDER, SEND-OUT TO REF LAB; TAT 18-24 HRS): SARS-CoV-2, NAA: NOT DETECTED

## 2019-12-10 ENCOUNTER — Encounter (INDEPENDENT_AMBULATORY_CARE_PROVIDER_SITE_OTHER): Payer: Self-pay

## 2019-12-15 ENCOUNTER — Telehealth: Payer: Self-pay | Admitting: Orthopedic Surgery

## 2019-12-15 NOTE — Telephone Encounter (Signed)
April 14th
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
## 2019-12-20 NOTE — Telephone Encounter (Signed)
Have been attempting to reach patient to schedule since Friday, 12/17/19 - number not going through. Checking for alternate #

## 2019-12-20 NOTE — Telephone Encounter (Signed)
Sending to you to schedule, please.

## 2019-12-21 ENCOUNTER — Encounter: Payer: Self-pay | Admitting: Orthopedic Surgery

## 2019-12-21 NOTE — Telephone Encounter (Signed)
Called back - still not going through; also tried alternated local number - female said wrong number. Sent letter.
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
## 2022-11-28 ENCOUNTER — Encounter: Payer: Self-pay | Admitting: Radiology
